# Patient Record
Sex: Male | Born: 1975 | Race: White | Hispanic: No | Marital: Married | Smoking: Never smoker
Health system: Southern US, Community
[De-identification: ages and names within clinical notes are randomized; demographics above are authoritative.]

## PROBLEM LIST (undated history)

## (undated) HISTORY — PX: NOSE SURGERY: SHX723

---

## 2002-10-27 ENCOUNTER — Ambulatory Visit (HOSPITAL_COMMUNITY): Admission: RE | Admit: 2002-10-27 | Discharge: 2002-10-27 | Payer: Self-pay | Admitting: Family Medicine

## 2002-10-27 ENCOUNTER — Encounter: Payer: Self-pay | Admitting: Family Medicine

## 2004-12-02 ENCOUNTER — Ambulatory Visit (HOSPITAL_COMMUNITY): Admission: RE | Admit: 2004-12-02 | Discharge: 2004-12-02 | Payer: Self-pay | Admitting: Family Medicine

## 2007-03-28 ENCOUNTER — Ambulatory Visit (HOSPITAL_COMMUNITY): Admission: RE | Admit: 2007-03-28 | Discharge: 2007-03-28 | Payer: Self-pay | Admitting: Family Medicine

## 2007-08-15 ENCOUNTER — Emergency Department (HOSPITAL_COMMUNITY): Admission: EM | Admit: 2007-08-15 | Discharge: 2007-08-15 | Payer: Self-pay | Admitting: Emergency Medicine

## 2010-12-14 LAB — POCT I-STAT, CHEM 8
BUN: 10
Creatinine, Ser: 1
Glucose, Bld: 103 — ABNORMAL HIGH
Sodium: 141
TCO2: 26

## 2010-12-14 LAB — POCT CARDIAC MARKERS
CKMB, poc: <1 — ABNORMAL LOW
Myoglobin, poc: 47.6

## 2014-08-14 ENCOUNTER — Emergency Department (HOSPITAL_COMMUNITY)
Admission: EM | Admit: 2014-08-14 | Discharge: 2014-08-15 | Disposition: A | Discharge status: Home / Self Care | Payer: No Typology Code available for payment source | Attending: Emergency Medicine | Admitting: Emergency Medicine

## 2014-08-14 ENCOUNTER — Emergency Department (HOSPITAL_COMMUNITY): Payer: No Typology Code available for payment source

## 2014-08-14 ENCOUNTER — Encounter (HOSPITAL_COMMUNITY): Payer: Self-pay

## 2014-08-14 DIAGNOSIS — Z880 Allergy status to penicillin: Secondary | ICD-10-CM | POA: Diagnosis not present

## 2014-08-14 DIAGNOSIS — Y9389 Activity, other specified: Secondary | ICD-10-CM | POA: Diagnosis not present

## 2014-08-14 DIAGNOSIS — Z23 Encounter for immunization: Secondary | ICD-10-CM | POA: Diagnosis not present

## 2014-08-14 DIAGNOSIS — T148XXA Other injury of unspecified body region, initial encounter: Secondary | ICD-10-CM

## 2014-08-14 DIAGNOSIS — S46912A Strain of unspecified muscle, fascia and tendon at shoulder and upper arm level, left arm, initial encounter: Secondary | ICD-10-CM | POA: Insufficient documentation

## 2014-08-14 DIAGNOSIS — Y92410 Unspecified street and highway as the place of occurrence of the external cause: Secondary | ICD-10-CM | POA: Diagnosis not present

## 2014-08-14 DIAGNOSIS — Y998 Other external cause status: Secondary | ICD-10-CM | POA: Insufficient documentation

## 2014-08-14 DIAGNOSIS — S4992XA Unspecified injury of left shoulder and upper arm, initial encounter: Secondary | ICD-10-CM | POA: Diagnosis present

## 2014-08-14 MED ORDER — KETOROLAC TROMETHAMINE 60 MG/2ML IM SOLN
60.0000 mg | Freq: Once | INTRAMUSCULAR | Status: AC
  Administered 2014-08-15: 60 mg via INTRAMUSCULAR
  Filled 2014-08-14: qty 2

## 2014-08-14 MED ORDER — METHOCARBAMOL 500 MG PO TABS
1000.0000 mg | ORAL_TABLET | Freq: Once | ORAL | Status: AC
Start: 2014-08-14 — End: 2014-08-15
  Administered 2014-08-15: 1000 mg via ORAL
  Filled 2014-08-14: qty 2

## 2014-08-14 MED ORDER — TETANUS-DIPHTH-ACELL PERTUSSIS 5-2.5-18.5 LF-MCG/0.5 IM SUSP
0.5000 mL | Freq: Once | INTRAMUSCULAR | Status: AC
  Administered 2014-08-15: 0.5 mL via INTRAMUSCULAR
  Filled 2014-08-14: qty 0.5
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
## 2014-08-15 MED ORDER — METHOCARBAMOL 500 MG PO TABS: 500.0000 mg | ORAL_TABLET | Freq: Two times a day (BID) | ORAL | Status: AC

## 2014-08-15 MED ORDER — NAPROXEN 375 MG PO TABS: 375.0000 mg | ORAL_TABLET | Freq: Two times a day (BID) | ORAL | Status: AC

## 2014-08-15 NOTE — Discharge Instructions (Signed)
Cryotherapy °Cryotherapy means treatment with cold. Ice or gel packs can be used to reduce both pain and swelling. Ice is the most helpful within the first 24 to 48 hours after an injury or flare-up from overusing a muscle or joint. Sprains, strains, spasms, burning pain, shooting pain, and aches can all be eased with ice. Ice can also be used when recovering from surgery. Ice is effective, has very few side effects, and is safe for most people to use. °PRECAUTIONS  °Ice is not a safe treatment option for people with: °· Raynaud phenomenon. This is a condition affecting small blood vessels in the extremities. Exposure to cold may cause your problems to return. °· Cold hypersensitivity. There are many forms of cold hypersensitivity, including: °¨ Cold urticaria. Red, itchy hives appear on the skin when the tissues begin to warm after being iced. °¨ Cold erythema. This is a red, itchy rash caused by exposure to cold. °¨ Cold hemoglobinuria. Red blood cells break down when the tissues begin to warm after being iced. The hemoglobin that carry oxygen are passed into the urine because they cannot combine with blood proteins fast enough. °· Numbness or altered sensitivity in the area being iced. °If you have any of the following conditions, do not use ice until you have discussed cryotherapy with your caregiver: °· Heart conditions, such as arrhythmia, angina, or chronic heart disease. °· High blood pressure. °· Healing wounds or open skin in the area being iced. °· Current infections. °· Rheumatoid arthritis. °· Poor circulation. °· Diabetes. °Ice slows the blood flow in the region it is applied. This is beneficial when trying to stop inflamed tissues from spreading irritating chemicals to surrounding tissues. However, if you expose your skin to cold temperatures for too long or without the proper protection, you can damage your skin or nerves. Watch for signs of skin damage due to cold. °HOME CARE INSTRUCTIONS °Follow  these tips to use ice and cold packs safely. °· Place a dry or damp towel between the ice and skin. A damp towel will cool the skin more quickly, so you may need to shorten the time that the ice is used. °· For a more rapid response, add gentle compression to the ice. °· Ice for no more than 10 to 20 minutes at a time. The bonier the area you are icing, the less time it will take to get the benefits of ice. °· Check your skin after 5 minutes to make sure there are no signs of a poor response to cold or skin damage. °· Rest 20 minutes or more between uses. °· Once your skin is numb, you can end your treatment. You can test numbness by very lightly touching your skin. The touch should be so light that you do not see the skin dimple from the pressure of your fingertip. When using ice, most people will feel these normal sensations in this order: cold, burning, aching, and numbness. °· Do not use ice on someone who cannot communicate their responses to pain, such as small children or people with dementia. °HOW TO MAKE AN ICE PACK °Ice packs are the most common way to use ice therapy. Other methods include ice massage, ice baths, and cryosprays. Muscle creams that cause a cold, tingly feeling do not offer the same benefits that ice offers and should not be used as a substitute unless recommended by your caregiver. °To make an ice pack, do one of the following: °· Place crushed ice or a   bag of frozen vegetables in a sealable plastic bag. Squeeze out the excess air. Place this bag inside another plastic bag. Slide the bag into a pillowcase or place a damp towel between your skin and the bag. °· Mix 3 parts water with 1 part rubbing alcohol. Freeze the mixture in a sealable plastic bag. When you remove the mixture from the freezer, it will be slushy. Squeeze out the excess air. Place this bag inside another plastic bag. Slide the bag into a pillowcase or place a damp towel between your skin and the bag. °SEEK MEDICAL CARE  IF: °· You develop white spots on your skin. This may give the skin a blotchy (mottled) appearance. °· Your skin turns blue or pale. °· Your skin becomes waxy or hard. °· Your swelling gets worse. °MAKE SURE YOU:  °· Understand these instructions. °· Will watch your condition. °· Will get help right away if you are not doing well or get worse. °Document Released: 10/31/2010 Document Revised: 07/21/2013 Document Reviewed: 10/31/2010 °[employer]® Patient Information ©2015 [employer]. This information is not intended to replace advice given to you by your health care provider. Make sure you discuss any questions you have with your health care provider. ° °
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
